# Patient Record
Sex: Male | Born: 1965 | Race: White | Hispanic: No | Marital: Married | Smoking: Never smoker
Health system: Southern US, Community
[De-identification: ages and names within clinical notes are randomized; demographics above are authoritative.]

---

## 2009-12-03 ENCOUNTER — Encounter: Admission: RE | Admit: 2009-12-03 | Discharge: 2009-12-03 | Payer: Self-pay | Admitting: Family Medicine

## 2013-10-18 ENCOUNTER — Other Ambulatory Visit: Payer: Self-pay | Admitting: Family Medicine

## 2013-10-18 DIAGNOSIS — R52 Pain, unspecified: Secondary | ICD-10-CM

## 2013-10-19 ENCOUNTER — Ambulatory Visit
Admission: RE | Admit: 2013-10-19 | Discharge: 2013-10-19 | Disposition: A | Discharge status: Home / Self Care | Payer: BC Managed Care – PPO | Source: Ambulatory Visit | Attending: Family Medicine | Admitting: Family Medicine

## 2013-10-19 DIAGNOSIS — R52 Pain, unspecified: Secondary | ICD-10-CM

## 2015-09-22 ENCOUNTER — Emergency Department (HOSPITAL_COMMUNITY)
Admission: EM | Admit: 2015-09-22 | Discharge: 2015-09-22 | Disposition: A | Discharge status: Home / Self Care | Payer: 59 | Attending: Emergency Medicine | Admitting: Emergency Medicine

## 2015-09-22 ENCOUNTER — Encounter (HOSPITAL_COMMUNITY): Payer: Self-pay | Admitting: Emergency Medicine

## 2015-09-22 DIAGNOSIS — Z8781 Personal history of (healed) traumatic fracture: Secondary | ICD-10-CM | POA: Insufficient documentation

## 2015-09-22 DIAGNOSIS — R040 Epistaxis: Secondary | ICD-10-CM | POA: Diagnosis not present

## 2015-09-22 MED ORDER — OXYMETAZOLINE HCL 0.05 % NA SOLN
1.0000 | Freq: Once | NASAL | Status: AC
  Administered 2015-09-22: 1 via NASAL
  Filled 2015-09-22: qty 15

## 2015-09-22 MED ORDER — LIDOCAINE VISCOUS 2 % MT SOLN
15.0000 mL | Freq: Once | OROMUCOSAL | Status: AC
  Administered 2015-09-22: 15 mL via OROMUCOSAL
  Filled 2015-09-22: qty 15

## 2015-09-22 MED ORDER — LIDOCAINE-EPINEPHRINE (PF) 2 %-1:200000 IJ SOLN
10.0000 mL | Freq: Once | INTRAMUSCULAR | Status: AC
  Administered 2015-09-22: 20 mL
  Filled 2015-09-22: qty 20

## 2015-09-22 MED ORDER — AMOXICILLIN-POT CLAVULANATE 875-125 MG PO TABS: 1.0000 | ORAL_TABLET | Freq: Two times a day (BID) | ORAL | Status: AC

## 2015-09-22 NOTE — ED Notes (Signed)
Patient states he had 1 nose bleed last night that lasted 10 minutes. Patient had another nose bleed today that lasted 1 hour before they went to urgent care. Urgent care said it looked like a posterior, larger vein; urgent care packed one nares. Bleeding appears to be controled with packing in right nostril.  Denies blood thinner use or any recent facial trauma.

## 2015-09-22 NOTE — ED Provider Notes (Signed)
CSN: [id number]     Arrival date & time 09/22/15  1616 History  By signing my name below, I, Joshua Whitney, attest that this documentation has been prepared under the direction and in the presence of [facility], PA-C. Electronically Signed: Elon Whitney ED Scribe. 09/22/2015. 5:36 PM.    Chief Complaint  Patient presents with  . Epistaxis   The history is provided by the patient. No language interpreter was used.    HPI Comments: Joshua Whitney is a 49 y.o. male who presents to the Emergency Department complaining of 3 episodes of right nare epistaxis onset two days ago.  The first two episodes lasted 5-10 minutes and resolved independently.  The current episode started 3.5 hours ago and the patient was seen at [facility].  At that facility, his right nostril was packed with improved, but continuing bleeding, and he was referred to ED on suspicion of posterior bleed.  He denies recent illness, trauma to nose.  He reports a hx of nasal fx requiring repair 30 years ago and some associated epistaxis surrounding the incident.  Otherwise, he denies a hx of epistaxis.  He denies epistaxis from the left nare.   History reviewed. No pertinent past medical history. History reviewed. No pertinent past surgical history. No family history on file. Social History  Substance Use Topics  . Smoking status: Never Smoker   . Smokeless tobacco: None  . Alcohol Use: No    Review of Systems  Constitutional: Negative for fever.  HENT: Positive for nosebleeds. Negative for facial swelling, sore throat and trouble swallowing.   Respiratory: Negative for shortness of breath, wheezing and stridor.   Allergic/Immunologic: Negative for immunocompromised state.      Allergies  Review of patient's allergies indicates not on file.  Home Medications   Prior to Admission medications   Not on File   BP 152/95 mmHg  Pulse 106  Temp(Src) 98.6 F (37 C) (Oral)  Resp 18  Ht  (1.702 m)  Wt  300 lb (136.079 kg)  BMI 46.98 kg/m2  SpO2 94% Physical Exam  Constitutional: He appears well-developed and well-nourished. No distress.  HENT:  Head: Normocephalic and atraumatic.  Blood soaked packing in the right nare.  Left nare is clear without evidence of any blood.  Oropharynx without visualized blood.   Neck: Neck supple.  Pulmonary/Chest: Effort normal.  Neurological: He is alert.  Skin: He is not diaphoretic.  Nursing note and vitals reviewed.   ED Course  .Epistaxis Management Date/Time: 09/22/2015 7:21 PM Performed by: Joshua Whitney Authorized by: Joshua Whitney Consent: Verbal consent obtained. Consent given by: patient Patient understanding: patient states understanding of the procedure being performed Patient identity confirmed: verbally with patient Local anesthetic: lidocaine 2% with epinephrine Patient sedated: no Treatment site: right anterior Repair method: merocel sponge Post-procedure assessment: bleeding stopped Treatment complexity: simple Recurrence: recurrence of recent bleed Patient tolerance: Patient tolerated the procedure well with no immediate complications   (including critical care time)  DIAGNOSTIC STUDIES: Oxygen Saturation is 94% on RA, adequate by my interpretation.    COORDINATION OF CARE:  5:32 PM Discussed treatment plan with patient at bedside.  Patient acknowledges and agrees with plan.    Labs Review Labs Reviewed - No data to display  Imaging Review No results found. I have personally reviewed and evaluated these images and lab results as part of my medical decision-making.   EKG Interpretation None        MDM  Final diagnoses:  Epistaxis    Afebrile, nontoxic patient with bleeding from right nare, seen at outside urgent care and sent to ED for further treatment.  Right nare with packing slightly dripping on arrival, likely because nasal tampon not infused with NS.  Discussed pros and cons and removal of nasal tampon  and reassessment and patient did decide he wanted this.  Nasal packing removed, no focal area of bleeding.  Mild oozing that is noted only by patient.  Larger nasal packing replaced with no e/o bleeding afteward.   D/C home with 24 hour recheck for removal, Rx augmentin, ENT referral.   Discussed result, findings, treatment, and follow up  with patient.  Pt given return precautions.  Pt verbalizes understanding and agrees with plan.         I personally performed the services described in this documentation, which was scribed in my presence. The recorded information has been reviewed and is accurate.    Joshua Dredgemily Lorelee Mclaurin, PA-C 09/22/15 Joshua Whitney  Joshua FossaElizabeth Rees, Whitney 09/22/15 2132

## 2015-09-22 NOTE — Discharge Instructions (Signed)
Read the information below.  Use the prescribed medication as directed.  Please discuss all new medications with your pharmacist.  You may return to the Emergency Department at any time for worsening condition or any new symptoms that concern you.    If you develop fevers, severe pain, uncontrolled bleeding, return to the ER for a recheck.     Nosebleed Nosebleeds are common. They are due to a crack in the inside lining of your nose (mucous membrane) or from a small blood vessel that starts to bleed. Nosebleeds can be caused by many conditions, such as injury, infections, dry mucous membranes or dry climate, medicines, nose picking, and home heating and cooling systems. Most nosebleeds come from blood vessels in the front of your nose. HOME CARE INSTRUCTIONS   Try controlling your nosebleed by pinching your nostrils gently and continuously for at least 10 minutes.  Avoid blowing or sniffing your nose for a number of hours after having a nosebleed.  Do not put gauze inside your nose yourself. If your nose was packed by your health care provider, try to maintain the pack inside of your nose until your health care provider removes it.  If a gauze pack was used and it starts to fall out, gently replace it or cut off the end of it.  If a balloon catheter was used to pack your nose, do not cut or remove it unless your health care provider has instructed you to do that.  Avoid lying down while you are having a nosebleed. Sit up and lean forward.  Use a nasal spray decongestant to help with a nosebleed as directed by your health care provider.  Do not use petroleum jelly or mineral oil in your nose. These can drip into your lungs.  Maintain humidity in your home by using less air conditioning or by using a humidifier.  Aspirinand blood thinners make bleeding more likely. If you are prescribed these medicines and you suffer from nosebleeds, ask your health care provider if you should stop taking the  medicines or adjust the dose. Do not stop medicines unless directed by your health care provider  Resume your normal activities as you are able, but avoid straining, lifting, or bending at the waist for several days.  If your nosebleed was caused by dry mucous membranes, use over-the-counter saline nasal spray or gel. This will keep the mucous membranes moist and allow them to heal. If you must use a lubricant, choose the water-soluble variety. Use it only sparingly, and do not use it within several hours of lying down.  Keep all follow-up visits as directed by your health care provider. This is important. SEEK MEDICAL CARE IF:  You have a fever.  You get frequent nosebleeds.  You are getting nosebleeds more often. SEEK IMMEDIATE MEDICAL CARE IF:  Your nosebleed lasts longer than 20 minutes.  Your nosebleed occurs after an injury to your face, and your nose looks crooked or broken.  You have unusual bleeding from other parts of your body.  You have unusual bruising on other parts of your body.  You feel light-headed or you faint.  You become sweaty.  You vomit blood.  Your nosebleed occurs after a head injury.   This information is not intended to replace advice given to you by your health care provider. Make sure you discuss any questions you have with your health care provider.   Document Released: 07/29/2005 Document Revised: 11/09/2014 Document Reviewed: 06/04/2014 Elsevier Interactive Patient Education 2016  Elsevier Inc. ° °

## 2015-09-24 ENCOUNTER — Encounter (HOSPITAL_COMMUNITY): Payer: Self-pay | Admitting: Emergency Medicine

## 2015-09-24 ENCOUNTER — Emergency Department (HOSPITAL_COMMUNITY)
Admission: EM | Admit: 2015-09-24 | Discharge: 2015-09-24 | Disposition: A | Discharge status: Home / Self Care | Payer: 59 | Attending: Emergency Medicine | Admitting: Emergency Medicine

## 2015-09-24 DIAGNOSIS — R040 Epistaxis: Secondary | ICD-10-CM

## 2015-09-24 DIAGNOSIS — Z79899 Other long term (current) drug therapy: Secondary | ICD-10-CM | POA: Diagnosis not present

## 2015-09-24 NOTE — Discharge Instructions (Signed)
Nosebleed  Nosebleeds are common. A nosebleed can be caused by many things, including:  · Getting hit hard in the nose.  · Infections.  · Dryness in your nose.  · A dry climate.  · Medicines.  · Picking your nose.  · Your home heating and cooling systems.  HOME CARE   · Try controlling your nosebleed by pinching your nostrils gently. Do this for at least 10 minutes.  · Avoid blowing or sniffing your nose for a number of hours after having a nosebleed.  · Do not put gauze inside of your nose yourself. If your nose was packed by your doctor, try to keep the pack inside of your nose until your doctor removes it.    If a gauze pack was used and it starts to fall out, gently replace it or cut off the end of it.    If a balloon catheter was used to pack your nose, do not cut or remove it unless told by your doctor.  · Avoid lying down while you are having a nosebleed. Sit up and lean forward.  · Use a nasal spray decongestant to help with a nosebleed as told by your doctor.  · Do not use petroleum jelly or mineral oil in your nose. These can drip into your lungs.  · Keep your house humid by using:    Less air conditioning.    A humidifier.  · Aspirin and blood thinners make bleeding more likely. If you are prescribed these medicines and you have nosebleeds, ask your doctor if you should stop taking the medicines or adjust the dose. Do not stop medicines unless told by your doctor.  · Resume your normal activities as you are able. Avoid straining, lifting, or bending at your waist for several days.  · If your nosebleed was caused by dryness in your nose, use over-the-counter saline nasal spray or gel. If you must use a lubricant:    Choose one that is water-soluble.    Use it only as needed.    Do not use it within several hours of lying down.  · Keep all follow-up visits as told by your doctor. This is important.  GET HELP IF:  · You have a fever.  · You get frequent nosebleeds.  · You are getting nosebleeds more  often.  GET HELP RIGHT AWAY IF:  · Your nosebleed lasts longer than 20 minutes.  · Your nosebleed occurs after an injury to your face, and your nose looks crooked or broken.  · You have unusual bleeding from other parts of your body.  · You have unusual bruising on other parts of your body.  · You feel light-headed or dizzy.  · You become sweaty.  · You throw up (vomit) blood.  · You have a nosebleed after a head injury.     This information is not intended to replace advice given to you by your health care provider. Make sure you discuss any questions you have with your health care provider.     Document Released: 07/28/2008 Document Revised: 11/09/2014 Document Reviewed: 06/04/2014  Elsevier Interactive Patient Education ©2016 Elsevier Inc.

## 2015-09-24 NOTE — ED Notes (Addendum)
Pt reports being seen at this ED Joshua Whitney([place]) on Sunday for nosebleed that would not stop flowing from the right nostril; pt received some packing material that was inserted into nose and was told to return in 2 days to get packing removed; pt reports no changes except increased congestion; pt reports discomfort but denies pain.

## 2015-09-24 NOTE — ED Provider Notes (Signed)
CSN: [id number]     Arrival date & time 09/24/15  0909 History   First MD Initiated Contact with Patient 09/24/15 [phone]     Chief Complaint  Patient presents with  . Epistaxis   (Consider location/radiation/quality/duration/timing/severity/associated sxs/prior Treatment) HPI  Patient is a 49 year old male with history of epistaxis presenting to have his nasal packing removed after having it placed in the ED 2 days ago for epistaxis. Patient was instructed to follow up with ENT, however he was told that he would have to wait an additional 3 days to have the packing removed due to availability. Patient has only had slight spotting since the packing was placed, but no further episodes of bleeding. No fevers or chills. No recent falls.  History reviewed. No pertinent past medical history. History reviewed. No pertinent past surgical history. History reviewed. No pertinent family history. Social History  Substance Use Topics  . Smoking status: Never Smoker   . Smokeless tobacco: None  . Alcohol Use: No    Review of Systems  Constitutional: Negative for fever and chills.  HENT: Positive for nosebleeds.   All other systems reviewed and are negative.     Allergies  Review of patient's allergies indicates not on file.  Home Medications   Prior to Admission medications   Medication Sig Start Date End Date Taking? Authorizing Provider  amoxicillin-clavulanate (AUGMENTIN) 875-125 MG tablet Take 1 tablet by mouth every 12 (twelve) hours. 09/22/15   Trixie DredgeEmily West, PA-C   BP 145/132 mmHg  Pulse 107  Temp(Src) 98.4 F (36.9 C) (Oral)  Resp 18  SpO2 98% Physical Exam  Constitutional: He is oriented to person, place, and time. He appears well-developed and well-nourished. No distress.  HENT:  Head: Normocephalic and atraumatic.  Packing in place in right nare. After removal no obvious signs of bleeding.   Eyes: EOM are normal. Pupils are equal, round, and reactive to light.  Neck:  Normal range of motion. Neck supple.  Cardiovascular: Normal rate, regular rhythm and normal heart sounds.   Pulmonary/Chest: Effort normal and breath sounds normal. No respiratory distress.  Abdominal: Soft. Bowel sounds are normal. He exhibits no distension. There is no tenderness.  Musculoskeletal: Normal range of motion.  Neurological: He is alert and oriented to person, place, and time.  Skin: Skin is warm and dry.  Psychiatric: He has a normal mood and affect. His behavior is normal.  Nursing note and vitals reviewed.   ED Course  Procedures (including critical care time) Labs Review Labs Reviewed - No data to display  Imaging Review No results found. I have personally reviewed and evaluated these images and lab results as part of my medical decision-making.   EKG Interpretation None      MDM   Final diagnoses:  Epistaxis   Patient is a 49 year old man with history of epistaxis who presents to the ED to have nasal packing removed after having it placed 2 days ago. Nasal packing was removed without complication and the patient did not have any further epistaxis while in the ED. Will discharge home. Return precautions reviewed.    Ardith Darkaleb M Parker, MD 09/24/15 1018  Azalia BilisKevin Campos, MD 09/24/15 [phone]

## 2016-02-03 DIAGNOSIS — D128 Benign neoplasm of rectum: Secondary | ICD-10-CM | POA: Diagnosis not present

## 2016-02-03 DIAGNOSIS — D122 Benign neoplasm of ascending colon: Secondary | ICD-10-CM | POA: Diagnosis not present

## 2016-02-03 DIAGNOSIS — D120 Benign neoplasm of cecum: Secondary | ICD-10-CM | POA: Diagnosis not present

## 2016-02-03 DIAGNOSIS — D126 Benign neoplasm of colon, unspecified: Secondary | ICD-10-CM | POA: Diagnosis not present

## 2016-02-03 DIAGNOSIS — D125 Benign neoplasm of sigmoid colon: Secondary | ICD-10-CM | POA: Diagnosis not present

## 2016-02-03 DIAGNOSIS — Z1211 Encounter for screening for malignant neoplasm of colon: Secondary | ICD-10-CM | POA: Diagnosis not present

## 2016-03-24 DIAGNOSIS — E7800 Pure hypercholesterolemia, unspecified: Secondary | ICD-10-CM | POA: Diagnosis not present

## 2017-05-03 DIAGNOSIS — Z79899 Other long term (current) drug therapy: Secondary | ICD-10-CM | POA: Diagnosis not present

## 2017-05-03 DIAGNOSIS — Z0000 Encounter for general adult medical examination without abnormal findings: Secondary | ICD-10-CM | POA: Diagnosis not present

## 2017-05-03 DIAGNOSIS — E781 Pure hyperglyceridemia: Secondary | ICD-10-CM | POA: Diagnosis not present

## 2017-05-12 DIAGNOSIS — Z79899 Other long term (current) drug therapy: Secondary | ICD-10-CM | POA: Diagnosis not present

## 2017-08-16 DIAGNOSIS — I10 Essential (primary) hypertension: Secondary | ICD-10-CM | POA: Diagnosis not present

## 2017-08-19 ENCOUNTER — Other Ambulatory Visit: Payer: Self-pay | Admitting: Family Medicine

## 2017-08-19 DIAGNOSIS — N183 Chronic kidney disease, stage 3 unspecified: Secondary | ICD-10-CM

## 2017-08-25 ENCOUNTER — Ambulatory Visit
Admission: RE | Admit: 2017-08-25 | Discharge: 2017-08-25 | Disposition: A | Discharge status: Home / Self Care | Payer: BLUE CROSS/BLUE SHIELD | Source: Ambulatory Visit | Attending: Family Medicine | Admitting: Family Medicine

## 2017-08-25 DIAGNOSIS — N183 Chronic kidney disease, stage 3 unspecified: Secondary | ICD-10-CM

## 2017-11-12 DIAGNOSIS — R040 Epistaxis: Secondary | ICD-10-CM | POA: Diagnosis not present

## 2017-11-12 DIAGNOSIS — N183 Chronic kidney disease, stage 3 (moderate): Secondary | ICD-10-CM | POA: Diagnosis not present

## 2017-11-16 DIAGNOSIS — R040 Epistaxis: Secondary | ICD-10-CM | POA: Diagnosis not present

## 2018-02-11 DIAGNOSIS — E559 Vitamin D deficiency, unspecified: Secondary | ICD-10-CM | POA: Diagnosis not present

## 2018-05-13 DIAGNOSIS — Z79899 Other long term (current) drug therapy: Secondary | ICD-10-CM | POA: Diagnosis not present

## 2018-05-13 DIAGNOSIS — E559 Vitamin D deficiency, unspecified: Secondary | ICD-10-CM | POA: Diagnosis not present

## 2018-05-13 DIAGNOSIS — Z0000 Encounter for general adult medical examination without abnormal findings: Secondary | ICD-10-CM | POA: Diagnosis not present

## 2018-05-13 DIAGNOSIS — Z23 Encounter for immunization: Secondary | ICD-10-CM | POA: Diagnosis not present

## 2018-05-13 DIAGNOSIS — E781 Pure hyperglyceridemia: Secondary | ICD-10-CM | POA: Diagnosis not present

## 2018-05-21 IMAGING — US US RENAL
1 series · 14 of 25 positions shown · non-contrast
Comparison: None.

CLINICAL DATA: Chronic kidney disease stage 3

EXAM:
RENAL / URINARY TRACT ULTRASOUND COMPLETE

[Series 1: us renal · 0.27mm/px · 14 of 40 slices shown]
[im 1/40]
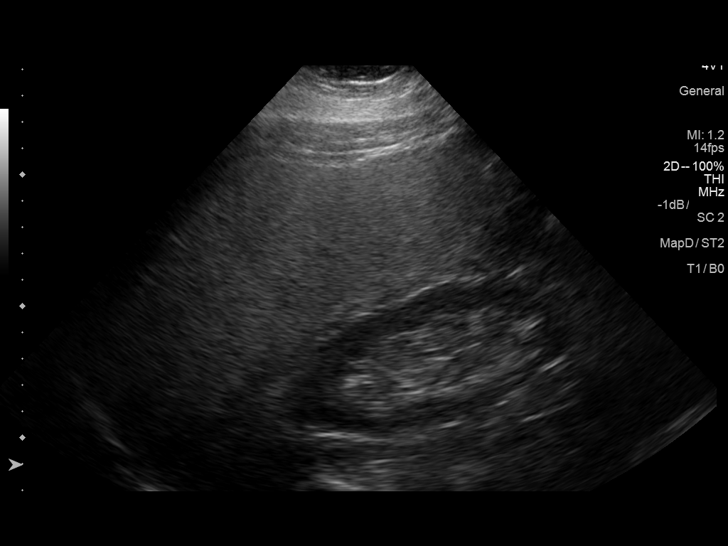
[im 4/40]
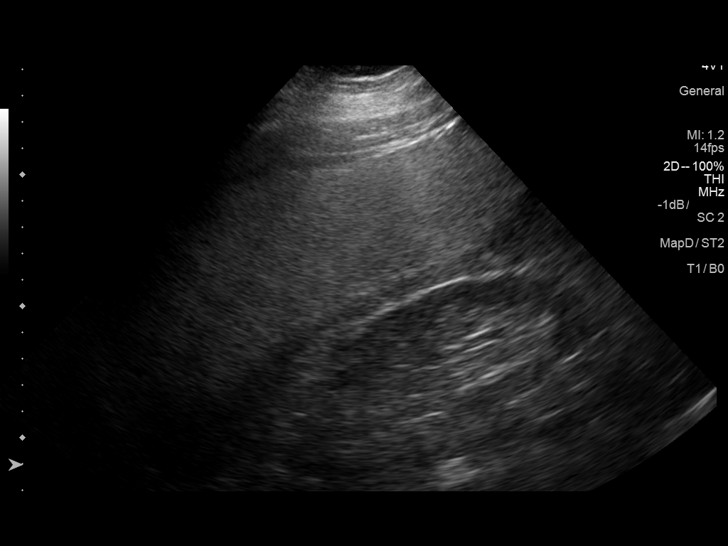
[im 7/40]
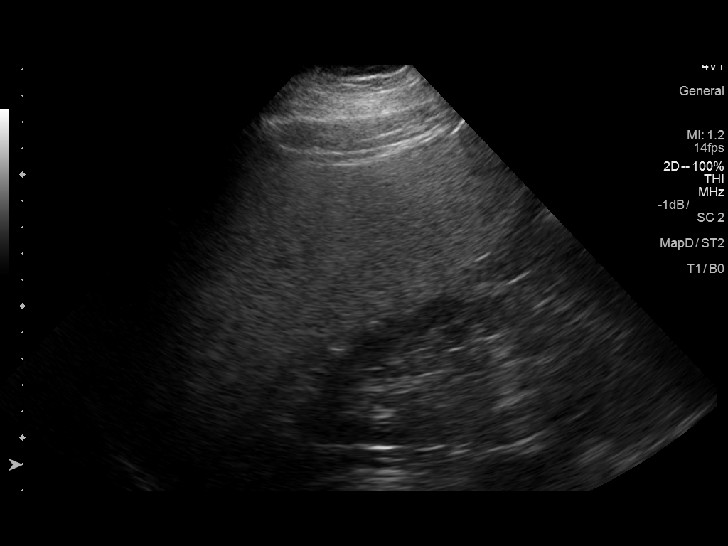
[im 10/40]
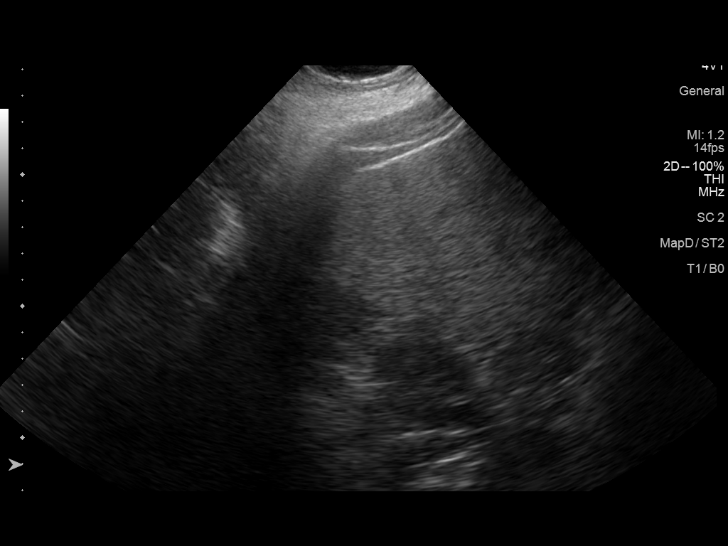
[im 14/40]
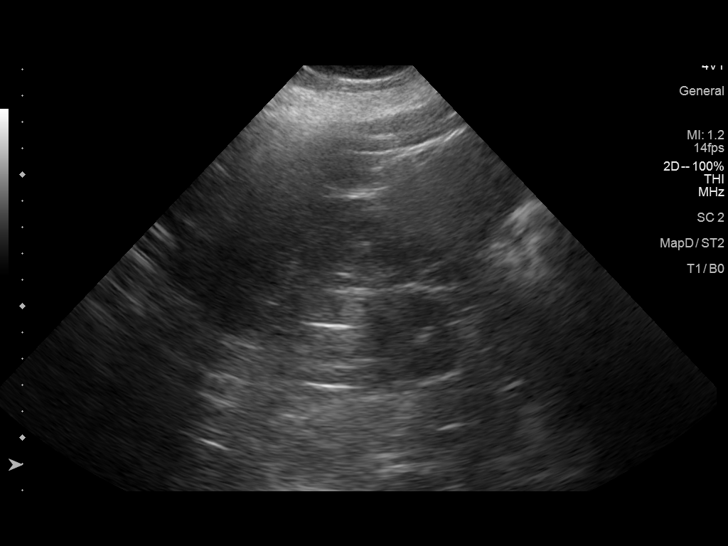
[im 15/40]
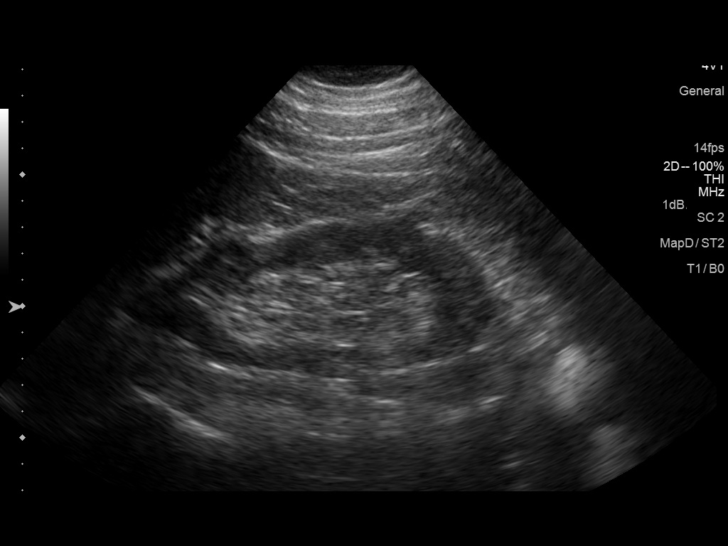
[im 18/40]
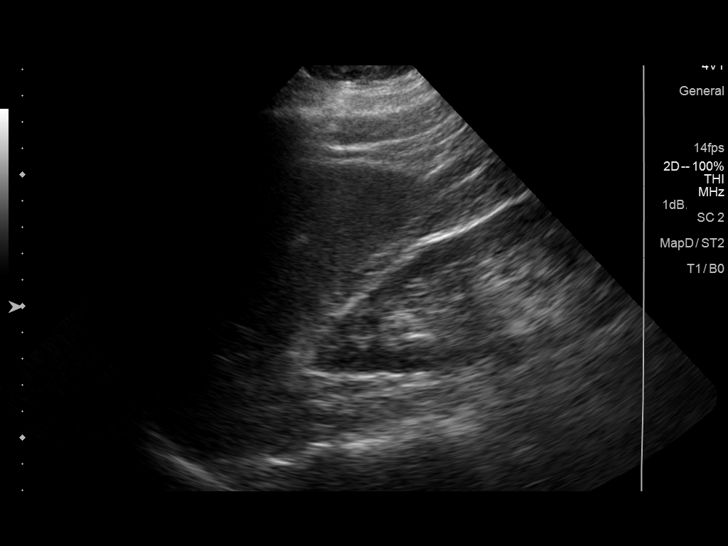
[im 22/40]
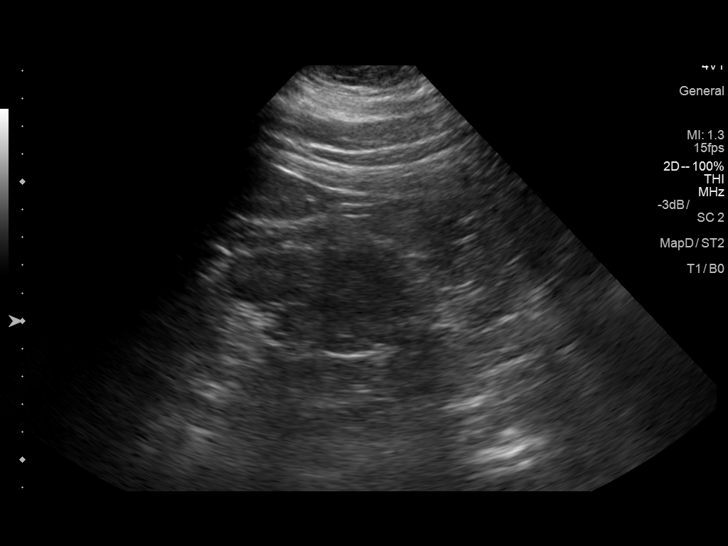
[im 25/40]
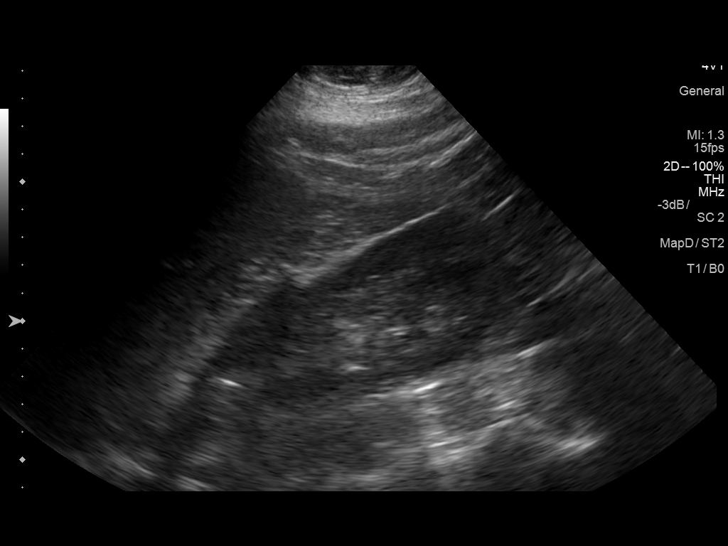
[im 27/40]
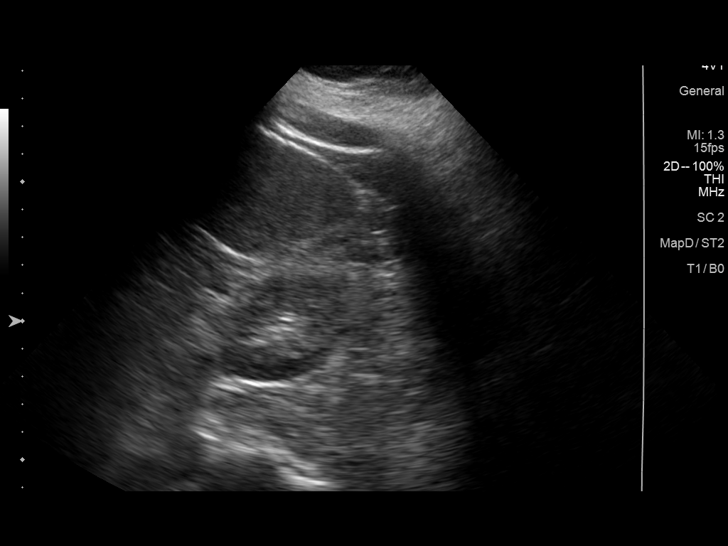
[im 30/40]
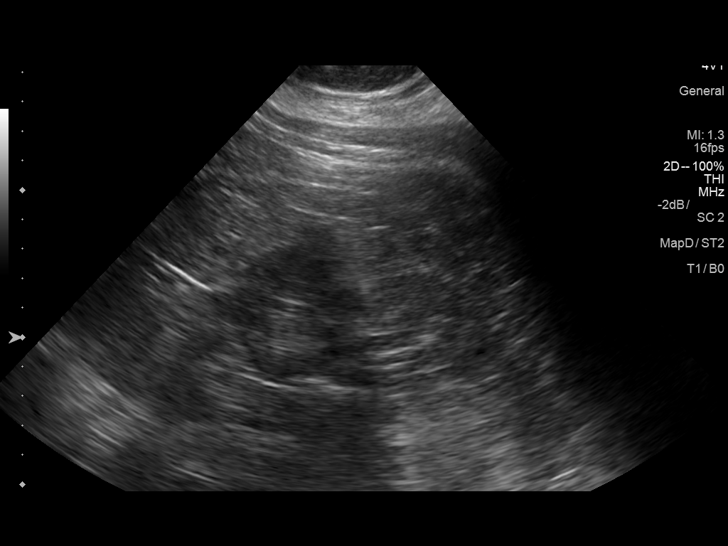
[im 33/40]
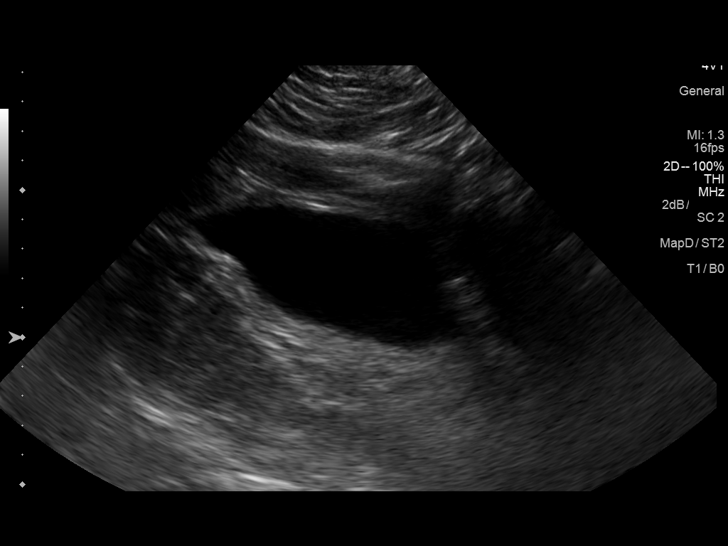
[im 36/40]
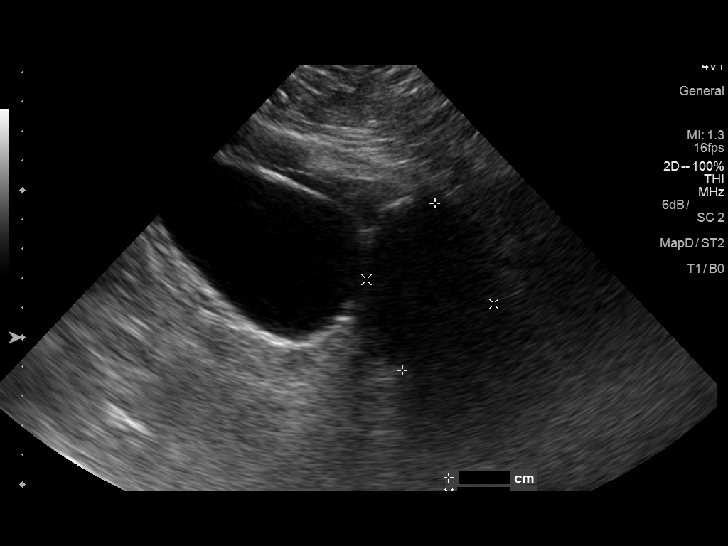
[im 40/40]
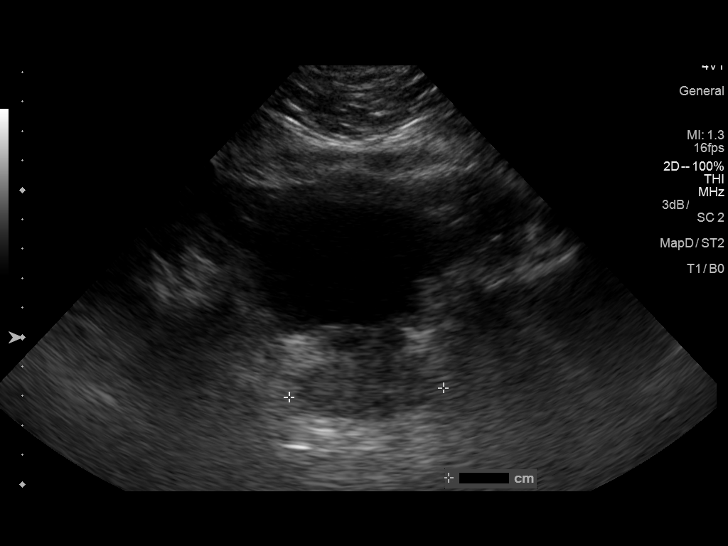

[14 of 25 positions shown; findings below may reference images not displayed]

FINDINGS: Right Kidney:

Length: 11.6 cm. Echogenicity within normal limits. No mass or
hydronephrosis visualized.

Left Kidney:

Length: 13.4 cm. Echogenicity within normal limits. No mass or
hydronephrosis visualized.

Bladder:

Appears normal for degree of bladder distention.
IMPRESSION: Unremarkable study.

## 2018-07-14 DIAGNOSIS — Z23 Encounter for immunization: Secondary | ICD-10-CM | POA: Diagnosis not present

## 2019-06-19 DIAGNOSIS — Z125 Encounter for screening for malignant neoplasm of prostate: Secondary | ICD-10-CM | POA: Diagnosis not present

## 2019-06-19 DIAGNOSIS — Z79899 Other long term (current) drug therapy: Secondary | ICD-10-CM | POA: Diagnosis not present

## 2019-06-19 DIAGNOSIS — I10 Essential (primary) hypertension: Secondary | ICD-10-CM | POA: Diagnosis not present

## 2019-06-19 DIAGNOSIS — Z0000 Encounter for general adult medical examination without abnormal findings: Secondary | ICD-10-CM | POA: Diagnosis not present

## 2019-06-19 DIAGNOSIS — R7301 Impaired fasting glucose: Secondary | ICD-10-CM | POA: Diagnosis not present

## 2019-06-19 DIAGNOSIS — E559 Vitamin D deficiency, unspecified: Secondary | ICD-10-CM | POA: Diagnosis not present

## 2019-11-22 DIAGNOSIS — Z83511 Family history of glaucoma: Secondary | ICD-10-CM | POA: Diagnosis not present

## 2020-01-18 ENCOUNTER — Ambulatory Visit: Payer: BC Managed Care – PPO | Attending: Internal Medicine

## 2020-01-18 DIAGNOSIS — Z23 Encounter for immunization: Secondary | ICD-10-CM

## 2020-01-18 NOTE — Progress Notes (Signed)
   Covid-19 Vaccination Clinic  Name:  Joshua Whitney    MRN: [id number] DOB: 10/10/1966  01/18/2020  [name] was observed post Covid-19 immunization for 15 minutes without incident. He was provided with Vaccine Information Sheet and instruction to access the V-Safe system.   [name] was instructed to call 911 with any severe reactions post vaccine: Marland Kitchen Difficulty breathing  . Swelling of face and throat  . A fast heartbeat  . A bad rash all over body  . Dizziness and weakness   Immunizations Administered    Name Date Dose VIS Date Route   Pfizer COVID-19 Vaccine 01/18/2020 11:52 AM 0.3 mL 10/13/2019 Intramuscular   Manufacturer: [employer], [employer]   Lot: [id number]   NDC: [address]-5

## 2020-02-12 ENCOUNTER — Ambulatory Visit: Payer: BC Managed Care – PPO | Attending: Internal Medicine

## 2020-02-12 DIAGNOSIS — Z23 Encounter for immunization: Secondary | ICD-10-CM

## 2020-02-12 NOTE — Progress Notes (Signed)
   Covid-19 Vaccination Clinic  Name:  Corion Sherrod    MRN: [id number] DOB: 1966/03/20  02/12/2020  Mr. Masden was observed post Covid-19 immunization for 15 minutes without incident. He was provided with Vaccine Information Sheet and instruction to access the V-Safe system.   Mr. Demma was instructed to call 911 with any severe reactions post vaccine: Marland Kitchen Difficulty breathing  . Swelling of face and throat  . A fast heartbeat  . A bad rash all over body  . Dizziness and weakness   Immunizations Administered    Name Date Dose VIS Date Route   Pfizer COVID-19 Vaccine 02/12/2020 11:19 AM 0.3 mL 10/13/2019 Intramuscular   Manufacturer: [employer], [employer]   Lot: [id number]   NDC: [address]-4

## 2020-06-24 DIAGNOSIS — E559 Vitamin D deficiency, unspecified: Secondary | ICD-10-CM | POA: Diagnosis not present

## 2020-06-24 DIAGNOSIS — R7301 Impaired fasting glucose: Secondary | ICD-10-CM | POA: Diagnosis not present

## 2020-06-24 DIAGNOSIS — Z125 Encounter for screening for malignant neoplasm of prostate: Secondary | ICD-10-CM | POA: Diagnosis not present

## 2020-06-24 DIAGNOSIS — Z1159 Encounter for screening for other viral diseases: Secondary | ICD-10-CM | POA: Diagnosis not present

## 2020-06-24 DIAGNOSIS — Z0000 Encounter for general adult medical examination without abnormal findings: Secondary | ICD-10-CM | POA: Diagnosis not present

## 2020-06-24 DIAGNOSIS — Z79899 Other long term (current) drug therapy: Secondary | ICD-10-CM | POA: Diagnosis not present

## 2020-06-24 DIAGNOSIS — R946 Abnormal results of thyroid function studies: Secondary | ICD-10-CM | POA: Diagnosis not present

## 2020-06-24 DIAGNOSIS — I10 Essential (primary) hypertension: Secondary | ICD-10-CM | POA: Diagnosis not present

## 2020-07-25 DIAGNOSIS — R197 Diarrhea, unspecified: Secondary | ICD-10-CM | POA: Diagnosis not present

## 2020-07-25 DIAGNOSIS — Z86010 Personal history of colonic polyps: Secondary | ICD-10-CM | POA: Diagnosis not present

## 2020-07-31 DIAGNOSIS — Z20828 Contact with and (suspected) exposure to other viral communicable diseases: Secondary | ICD-10-CM | POA: Diagnosis not present

## 2020-08-26 DIAGNOSIS — E039 Hypothyroidism, unspecified: Secondary | ICD-10-CM | POA: Diagnosis not present

## 2020-09-09 DIAGNOSIS — Z1159 Encounter for screening for other viral diseases: Secondary | ICD-10-CM | POA: Diagnosis not present

## 2020-09-12 DIAGNOSIS — K635 Polyp of colon: Secondary | ICD-10-CM | POA: Diagnosis not present

## 2020-09-12 DIAGNOSIS — K640 First degree hemorrhoids: Secondary | ICD-10-CM | POA: Diagnosis not present

## 2020-09-12 DIAGNOSIS — Z86010 Personal history of colonic polyps: Secondary | ICD-10-CM | POA: Diagnosis not present

## 2020-09-17 ENCOUNTER — Ambulatory Visit: Payer: BC Managed Care – PPO | Attending: Internal Medicine

## 2020-09-17 DIAGNOSIS — Z23 Encounter for immunization: Secondary | ICD-10-CM

## 2020-09-17 NOTE — Progress Notes (Signed)
   Covid-19 Vaccination Clinic  Name:  Joshua Whitney    MRN: [id number] DOB: 19-Nov-1965  09/17/2020  [name] was observed post Covid-19 immunization for 15 minutes without incident. He was provided with Vaccine Information Sheet and instruction to access the V-Safe system.   [name] was instructed to call 911 with any severe reactions post vaccine: Marland Kitchen Difficulty breathing  . Swelling of face and throat  . A fast heartbeat  . A bad rash all over body  . Dizziness and weakness   Immunizations Administered    Name Date Dose VIS Date Route   Pfizer COVID-19 Vaccine 09/17/2020  3:34 PM 0.3 mL 08/21/2020 Intramuscular   Manufacturer: [employer], [employer]   Lot: [id number]   NDC: [address]-5

## 2021-05-09 DIAGNOSIS — E039 Hypothyroidism, unspecified: Secondary | ICD-10-CM | POA: Diagnosis not present

## 2021-07-02 DIAGNOSIS — U071 COVID-19: Secondary | ICD-10-CM | POA: Diagnosis not present

## 2021-07-16 DIAGNOSIS — Z79899 Other long term (current) drug therapy: Secondary | ICD-10-CM | POA: Diagnosis not present

## 2021-07-16 DIAGNOSIS — R7301 Impaired fasting glucose: Secondary | ICD-10-CM | POA: Diagnosis not present

## 2021-07-16 DIAGNOSIS — Z13220 Encounter for screening for lipoid disorders: Secondary | ICD-10-CM | POA: Diagnosis not present

## 2021-07-16 DIAGNOSIS — E039 Hypothyroidism, unspecified: Secondary | ICD-10-CM | POA: Diagnosis not present

## 2021-07-16 DIAGNOSIS — I10 Essential (primary) hypertension: Secondary | ICD-10-CM | POA: Diagnosis not present

## 2021-07-16 DIAGNOSIS — Z0000 Encounter for general adult medical examination without abnormal findings: Secondary | ICD-10-CM | POA: Diagnosis not present

## 2021-07-16 DIAGNOSIS — E559 Vitamin D deficiency, unspecified: Secondary | ICD-10-CM | POA: Diagnosis not present

## 2022-12-09 DIAGNOSIS — Z125 Encounter for screening for malignant neoplasm of prostate: Secondary | ICD-10-CM | POA: Diagnosis not present

## 2022-12-09 DIAGNOSIS — Z23 Encounter for immunization: Secondary | ICD-10-CM | POA: Diagnosis not present

## 2022-12-09 DIAGNOSIS — Z79899 Other long term (current) drug therapy: Secondary | ICD-10-CM | POA: Diagnosis not present

## 2022-12-09 DIAGNOSIS — R7303 Prediabetes: Secondary | ICD-10-CM | POA: Diagnosis not present

## 2022-12-09 DIAGNOSIS — Z0000 Encounter for general adult medical examination without abnormal findings: Secondary | ICD-10-CM | POA: Diagnosis not present

## 2022-12-09 DIAGNOSIS — Z13220 Encounter for screening for lipoid disorders: Secondary | ICD-10-CM | POA: Diagnosis not present

## 2022-12-23 DIAGNOSIS — M25552 Pain in left hip: Secondary | ICD-10-CM | POA: Diagnosis not present

## 2023-01-07 DIAGNOSIS — M25552 Pain in left hip: Secondary | ICD-10-CM | POA: Diagnosis not present

## 2024-03-09 DIAGNOSIS — Z23 Encounter for immunization: Secondary | ICD-10-CM | POA: Diagnosis not present

## 2024-03-09 DIAGNOSIS — E039 Hypothyroidism, unspecified: Secondary | ICD-10-CM | POA: Diagnosis not present

## 2024-03-09 DIAGNOSIS — Z0000 Encounter for general adult medical examination without abnormal findings: Secondary | ICD-10-CM | POA: Diagnosis not present

## 2024-03-09 DIAGNOSIS — E559 Vitamin D deficiency, unspecified: Secondary | ICD-10-CM | POA: Diagnosis not present

## 2024-03-09 DIAGNOSIS — R7303 Prediabetes: Secondary | ICD-10-CM | POA: Diagnosis not present

## 2024-03-09 DIAGNOSIS — Z79899 Other long term (current) drug therapy: Secondary | ICD-10-CM | POA: Diagnosis not present
